# Patient Record
Sex: Female | Born: 1975 | Race: White | Hispanic: No | Marital: Married | Smoking: Former smoker
Health system: Southern US, Community
[De-identification: ages and names within clinical notes are randomized; demographics above are authoritative.]

## PROBLEM LIST (undated history)

## (undated) ENCOUNTER — Emergency Department: Discharge status: Absent without leave | Payer: BC Managed Care – PPO

## (undated) DIAGNOSIS — E039 Hypothyroidism, unspecified: Secondary | ICD-10-CM

---

## 2006-02-17 ENCOUNTER — Encounter: Admission: RE | Admit: 2006-02-17 | Discharge: 2006-02-17 | Payer: Self-pay | Admitting: Family Medicine

## 2007-04-02 IMAGING — US US EXTREM LOW VENOUS*L*
1 series · 14 of 24 positions shown · non-contrast
Comparison: none

CLINICAL DATA: Symptomatic left lower extremity varicose vein 

Left lower extremity venous Doppler ultrasound:
TECHNIQUE: Gray-scale sonography with compression as well as color and duplex
Doppler ultrasound were performed to evaluate the deep venous system from the
level of the common femoral vein through the popliteal and proximal calf veins.
Standing gray-scale sonography and duplex Doppler evaluation of the superficial
venous system with augmentation was performed.

[Series 1: unknown · 14 of 28 slices shown]
[im 1/28]
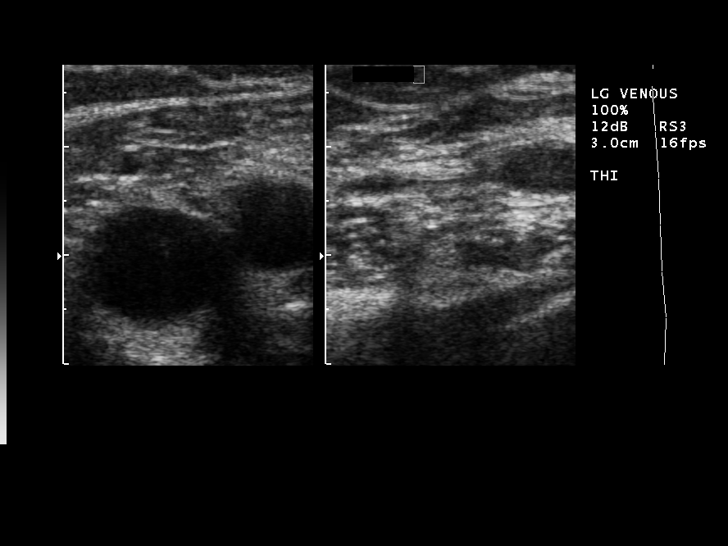
[im 3/28]
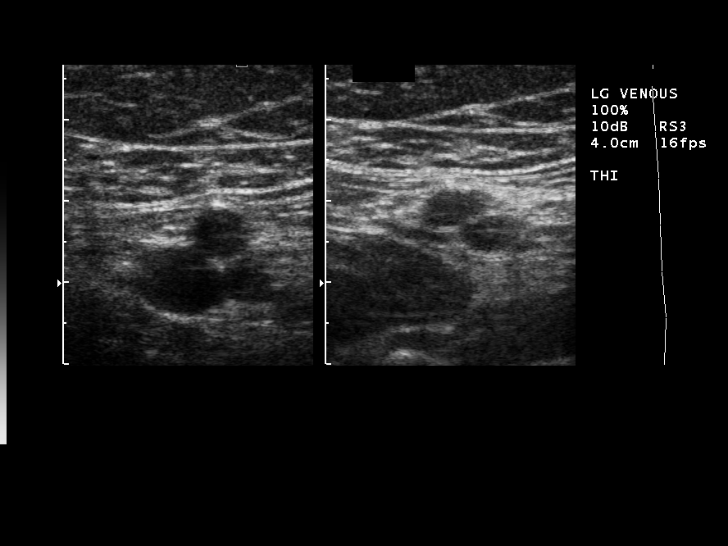
[im 5/28]
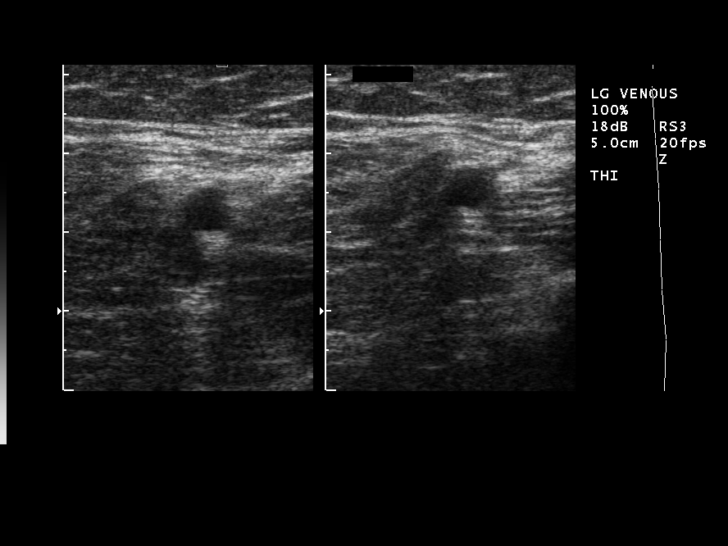
[im 8/28]
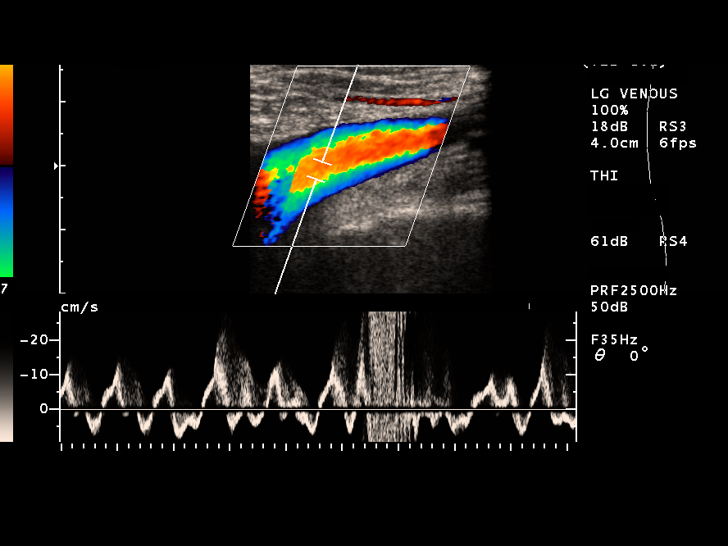
[im 9/28]
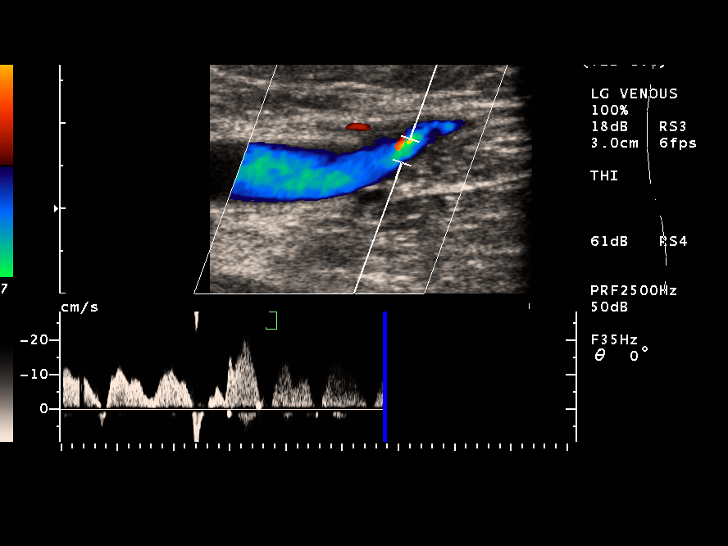
[im 11/28]
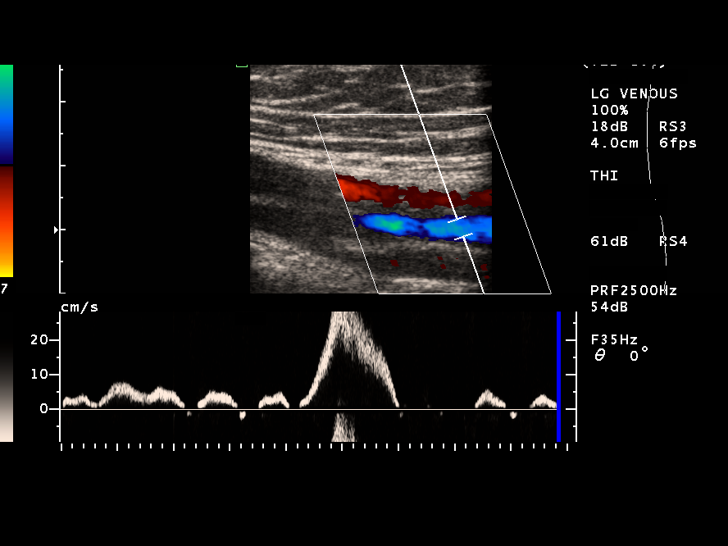
[im 13/28]
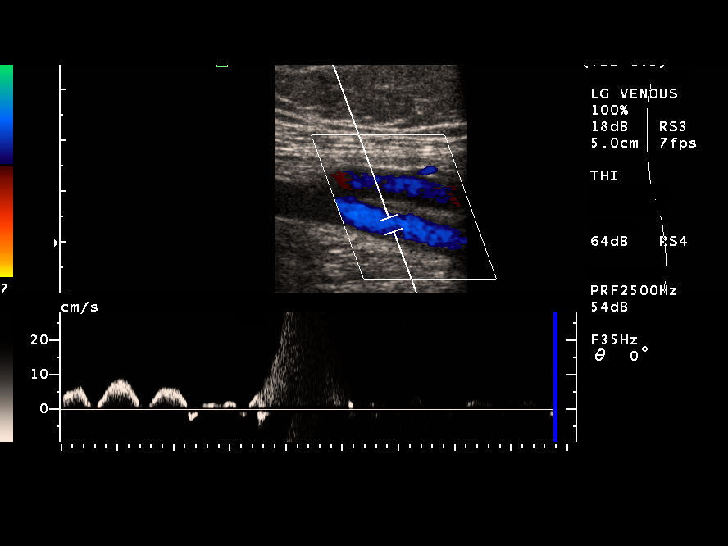
[im 15/28]
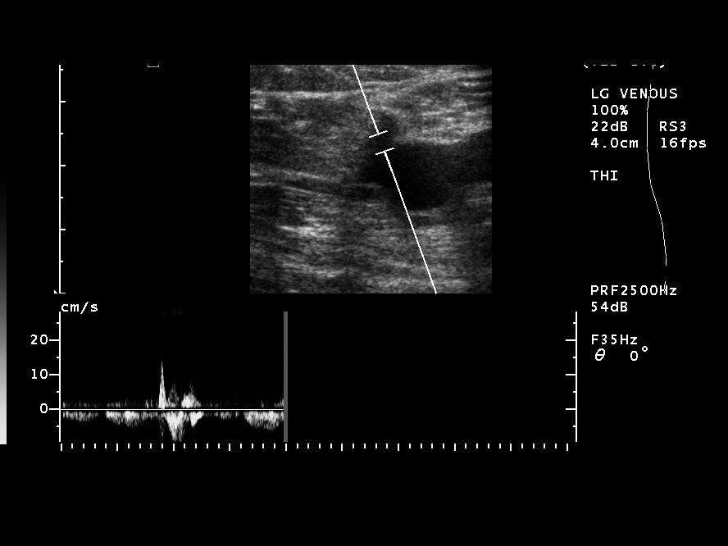
[im 17/28]
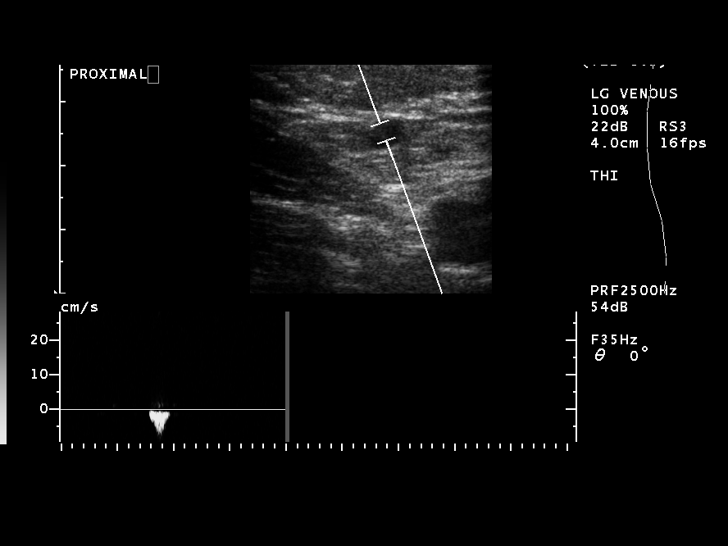
[im 19/28]
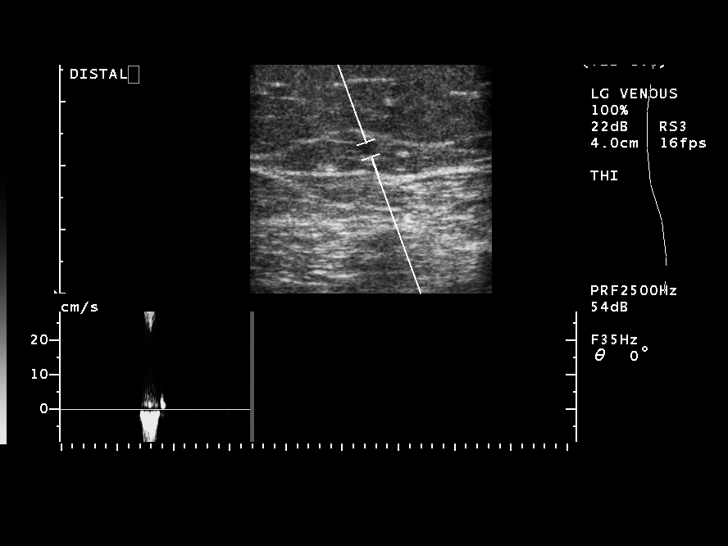
[im 22/28]
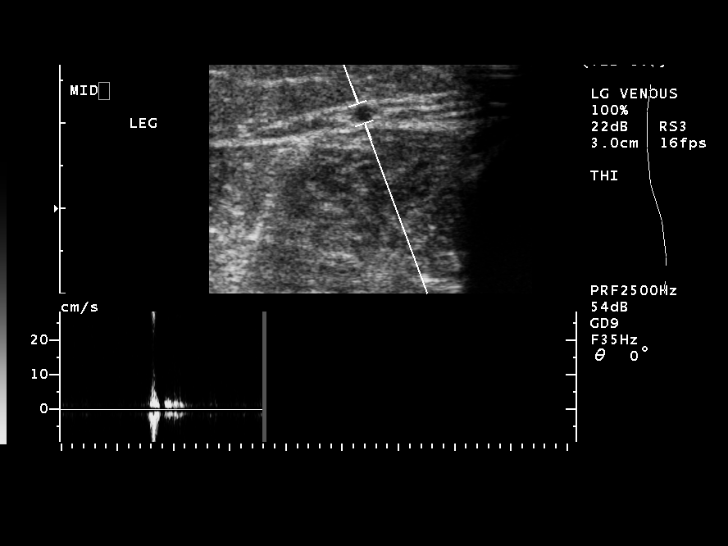
[im 23/28]
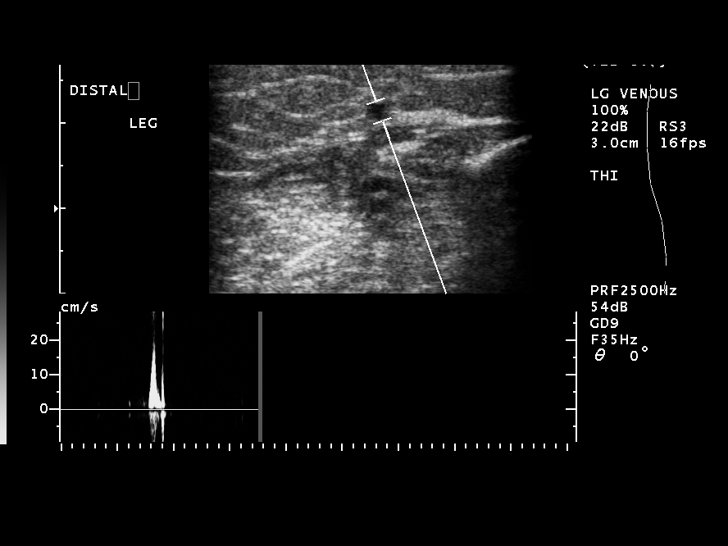
[im 25/28]
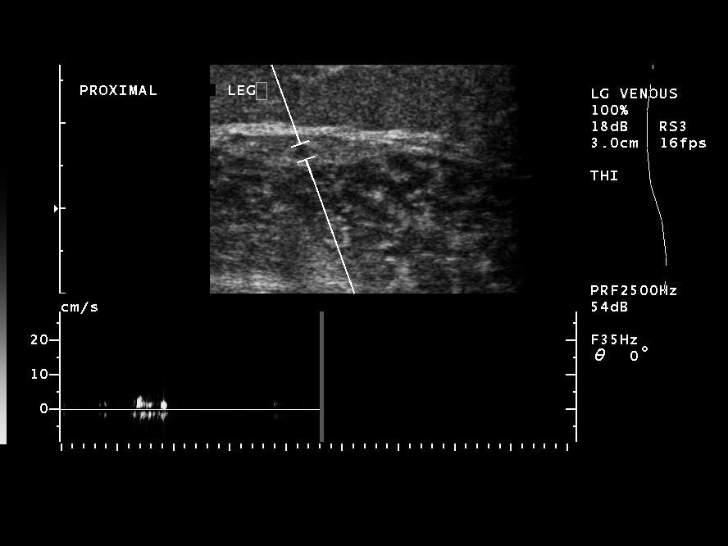
[im 28/28]
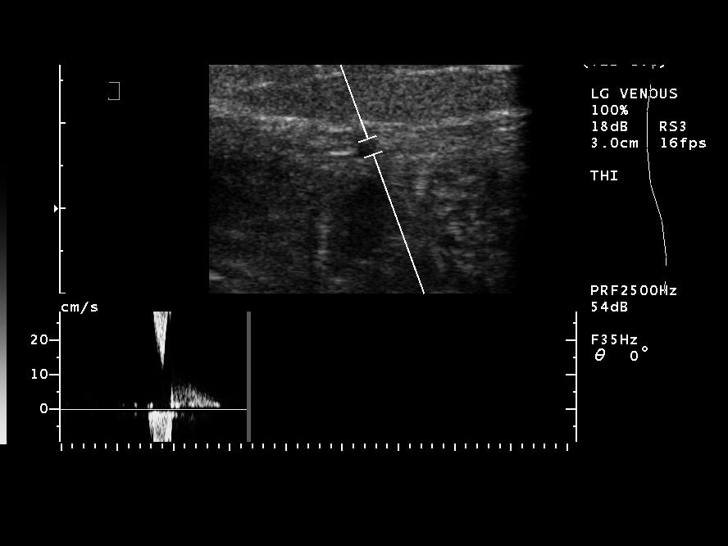

[14 of 24 positions shown; findings below may reference images not displayed]

FINDINGS: The lower extremity deep venous system demonstrates normal
compressibility, phasicity, and augmentation. No evidence of DVT.

The left greater and short saphenous veins are visualized throughout their
course. They are normal in caliber, with no evidence of reflux or valvular
incompetence.
IMPRESSION: 1. Normal deep and superficial venous systems of the left lower extremity

## 2007-07-12 ENCOUNTER — Ambulatory Visit: Payer: Self-pay | Admitting: Family Medicine

## 2007-07-12 DIAGNOSIS — M25559 Pain in unspecified hip: Secondary | ICD-10-CM

## 2007-07-12 LAB — CONVERTED CEMR LAB
Albumin: 4.2 g/dL (ref 3.5–5.2)
Alkaline Phosphatase: 44 units/L (ref 39–117)
CO2: 21 meq/L (ref 19–32)
Calcium: 8.8 mg/dL (ref 8.4–10.5)
Chloride: 109 meq/L (ref 96–112)
Cholesterol: 142 mg/dL (ref 0–200)
Creatinine, Ser: 0.77 mg/dL (ref 0.40–1.20)
Glucose, Bld: 79 mg/dL (ref 70–99)
LDL Cholesterol: 70 mg/dL (ref 0–99)
Sodium: 140 meq/L (ref 135–145)
VLDL: 20 mg/dL (ref 0–40)

## 2007-07-13 ENCOUNTER — Encounter: Payer: Self-pay | Admitting: Family Medicine

## 2007-09-27 ENCOUNTER — Ambulatory Visit: Payer: Self-pay | Admitting: Family Medicine

## 2007-12-18 ENCOUNTER — Ambulatory Visit: Payer: Self-pay | Admitting: *Deleted

## 2007-12-18 DIAGNOSIS — J069 Acute upper respiratory infection, unspecified: Secondary | ICD-10-CM | POA: Insufficient documentation

## 2009-11-02 ENCOUNTER — Inpatient Hospital Stay (HOSPITAL_COMMUNITY): Admission: AD | Admit: 2009-11-02 | Discharge: 2009-11-06 | Payer: Self-pay | Admitting: Obstetrics and Gynecology

## 2011-03-09 LAB — CBC
HCT: 18.1 % — ABNORMAL LOW (ref 36.0–46.0)
HCT: 18.6 % — ABNORMAL LOW (ref 36.0–46.0)
Hemoglobin: 6.4 g/dL — CL (ref 12.0–15.0)
MCHC: 34.2 g/dL (ref 30.0–36.0)
MCV: 94 fL (ref 78.0–100.0)
Platelets: 132 10*3/uL — ABNORMAL LOW (ref 150–400)
Platelets: 157 10*3/uL (ref 150–400)
RBC: 1.96 MIL/uL — ABNORMAL LOW (ref 3.87–5.11)
RDW: 14.1 % (ref 11.5–15.5)
RDW: 14.3 % (ref 11.5–15.5)
WBC: 9.8 10*3/uL (ref 4.0–10.5)

## 2011-03-10 LAB — CBC
HCT: 23.8 % — ABNORMAL LOW (ref 36.0–46.0)
HCT: 31.8 % — ABNORMAL LOW (ref 36.0–46.0)
Hemoglobin: 10.9 g/dL — ABNORMAL LOW (ref 12.0–15.0)
Hemoglobin: 8.2 g/dL — ABNORMAL LOW (ref 12.0–15.0)
MCHC: 34.4 g/dL (ref 30.0–36.0)
Platelets: 176 10*3/uL (ref 150–400)
RBC: 2.54 MIL/uL — ABNORMAL LOW (ref 3.87–5.11)
RDW: 13.8 % (ref 11.5–15.5)
RDW: 14.1 % (ref 11.5–15.5)
WBC: 10.1 10*3/uL (ref 4.0–10.5)

## 2012-10-17 ENCOUNTER — Other Ambulatory Visit (HOSPITAL_COMMUNITY): Payer: Self-pay | Admitting: Obstetrics and Gynecology

## 2012-10-17 DIAGNOSIS — N979 Female infertility, unspecified: Secondary | ICD-10-CM

## 2012-10-23 ENCOUNTER — Ambulatory Visit (HOSPITAL_COMMUNITY): Payer: BC Managed Care – PPO

## 2014-05-12 ENCOUNTER — Encounter: Payer: Self-pay | Admitting: Emergency Medicine

## 2014-05-12 ENCOUNTER — Emergency Department
Admission: EM | Admit: 2014-05-12 | Discharge: 2014-05-12 | Disposition: A | Discharge status: Home / Self Care | Payer: BC Managed Care – PPO | Source: Home / Self Care | Attending: Emergency Medicine | Admitting: Emergency Medicine

## 2014-05-12 DIAGNOSIS — H9209 Otalgia, unspecified ear: Secondary | ICD-10-CM

## 2014-05-12 HISTORY — DX: Hypothyroidism, unspecified: E03.9

## 2014-05-12 MED ORDER — AMOXICILLIN 875 MG PO TABS: 875.0000 mg | ORAL_TABLET | Freq: Two times a day (BID) | ORAL | Status: AC

## 2014-05-12 MED ORDER — FLUCONAZOLE 150 MG PO TABS: 150.0000 mg | ORAL_TABLET | Freq: Once | ORAL | Status: AC

## 2014-05-12 NOTE — ED Provider Notes (Signed)
CSN: [id number]     Arrival date & time 05/12/14  1101 History   First MD Initiated Contact with Patient 05/12/14 1112     Chief Complaint  Patient presents with  . Otalgia   (Consider location/radiation/quality/duration/timing/severity/associated sxs/prior Treatment) HPI Meghan Schneider is a 38 y.o. female who complains of onset of cold symptoms for 2 days.  The symptoms are constant and mild-moderate in severity. No sore throat No cough No pleuritic pain No wheezing No nasal congestion No post-nasal drainage No sinus pain/pressure No chest congestion No itchy/red eyes + L earache No hemoptysis No SOB No chills/sweats No fever No nausea No vomiting No abdominal pain No diarrhea No skin rashes No fatigue No myalgias No headache     No past medical history on file. No past surgical history on file. No family history on file. History  Substance Use Topics  . Smoking status: Not on file  . Smokeless tobacco: Not on file  . Alcohol Use: Not on file   OB History   No data available     Review of Systems  All other systems reviewed and are negative.   Allergies  Review of patient's allergies indicates no known allergies.  Home Medications   Prior to Admission medications   Medication Sig Start Date End Date Taking? Authorizing Provider  levothyroxine (SYNTHROID, LEVOTHROID) 50 MCG tablet Take 50 mcg by mouth daily before breakfast.   Yes Historical Provider, MD   BP 106/66  Pulse 70  Temp(Src) 97.8 F (36.6 C) (Oral)  Resp 18  Ht 5\' 8"  (1.727 m)  Wt 158 lb 12 oz (72.009 kg)  BMI 24.14 kg/m2  SpO2 99% Physical Exam  Nursing note and vitals reviewed. Constitutional: She is oriented to person, place, and time. She appears well-developed and well-nourished.  HENT:  Head: Normocephalic and atraumatic.  Right Ear: Tympanic membrane, external ear and ear canal normal.  Left Ear: External ear and ear canal normal. No swelling. No foreign bodies. No mastoid  tenderness. Tympanic membrane is injected and erythematous (mild). Tympanic membrane is not bulging.  No middle ear effusion.  Nose: Nose normal.  Mouth/Throat: No oropharyngeal exudate, posterior oropharyngeal edema or posterior oropharyngeal erythema.  Eyes: No scleral icterus.  Neck: Neck supple.  Cardiovascular: Regular rhythm and normal heart sounds.   Pulmonary/Chest: Effort normal and breath sounds normal. No respiratory distress. She has no decreased breath sounds. She has no wheezes.  Neurological: She is alert and oriented to person, place, and time.  Skin: Skin is warm and dry.  Psychiatric: She has a normal mood and affect. Her speech is normal.    ED Course  Procedures (including critical care time) Labs Review Labs Reviewed - No data to display  Imaging Review No results found.   MDM   1. Ear pain    1)  Take the prescribed antibiotic as instructed.  Hold for a few days since likely viral.  If not improving, take ABX.  If still not improving, consider ear drops. 2)  Use nasal saline solution (over the counter) at least 3 times a day. 3)  Use over the counter decongestants like Zyrtec-D every 12 hours as needed to help with congestion.  If you have hypertension, do not take medicines with sudafed.  4)  Can take tylenol every 6 hours or motrin every 8 hours for pain or fever. 5)  Follow up with your primary doctor if no improvement in 5-7 days, sooner if increasing pain, fever, or new symptoms.  Marlaine HindJeffrey H Saban Heinlen, MD 05/12/14 [phone]

## 2014-05-12 NOTE — ED Notes (Signed)
C/O pain in L ear worsening.  Sound seems more muffled than the ear actually hurting.
